# Patient Record
Sex: Female | Born: 1940 | Race: White | Hispanic: No | Marital: Married | Smoking: Never smoker
Health system: Southern US, Community
[De-identification: ages and names within clinical notes are randomized; demographics above are authoritative.]

## PROBLEM LIST (undated history)

## (undated) DIAGNOSIS — Z87718 Personal history of other specified (corrected) congenital malformations of genitourinary system: Secondary | ICD-10-CM

## (undated) DIAGNOSIS — N951 Menopausal and female climacteric states: Secondary | ICD-10-CM

## (undated) DIAGNOSIS — I639 Cerebral infarction, unspecified: Secondary | ICD-10-CM

## (undated) DIAGNOSIS — J45909 Unspecified asthma, uncomplicated: Secondary | ICD-10-CM

## (undated) DIAGNOSIS — N189 Chronic kidney disease, unspecified: Secondary | ICD-10-CM

## (undated) DIAGNOSIS — G459 Transient cerebral ischemic attack, unspecified: Secondary | ICD-10-CM

## (undated) DIAGNOSIS — I10 Essential (primary) hypertension: Secondary | ICD-10-CM

## (undated) DIAGNOSIS — L309 Dermatitis, unspecified: Secondary | ICD-10-CM

## (undated) DIAGNOSIS — M1990 Unspecified osteoarthritis, unspecified site: Secondary | ICD-10-CM

## (undated) DIAGNOSIS — D649 Anemia, unspecified: Secondary | ICD-10-CM

## (undated) DIAGNOSIS — K5792 Diverticulitis of intestine, part unspecified, without perforation or abscess without bleeding: Secondary | ICD-10-CM

## (undated) DIAGNOSIS — E785 Hyperlipidemia, unspecified: Secondary | ICD-10-CM

## (undated) HISTORY — DX: Transient cerebral ischemic attack, unspecified: G45.9

## (undated) HISTORY — DX: Unspecified osteoarthritis, unspecified site: M19.90

## (undated) HISTORY — DX: Cerebral infarction, unspecified: I63.9

## (undated) HISTORY — DX: Menopausal and female climacteric states: N95.1

## (undated) HISTORY — DX: Dermatitis, unspecified: L30.9

## (undated) HISTORY — DX: Personal history of other specified (corrected) congenital malformations of genitourinary system: Z87.718

## (undated) HISTORY — DX: Essential (primary) hypertension: I10

## (undated) HISTORY — PX: COLONOSCOPY: SHX174

## (undated) HISTORY — DX: Hyperlipidemia, unspecified: E78.5

## (undated) HISTORY — DX: Diverticulitis of intestine, part unspecified, without perforation or abscess without bleeding: K57.92

## (undated) HISTORY — DX: Anemia, unspecified: D64.9

---

## 1961-04-24 HISTORY — PX: NEPHRECTOMY: SHX65

## 2006-04-24 HISTORY — PX: EYE SURGERY: SHX253

## 2008-08-07 ENCOUNTER — Ambulatory Visit: Payer: Self-pay | Admitting: Hematology and Oncology

## 2012-12-24 ENCOUNTER — Other Ambulatory Visit: Payer: Self-pay | Admitting: *Deleted

## 2012-12-24 DIAGNOSIS — G459 Transient cerebral ischemic attack, unspecified: Secondary | ICD-10-CM

## 2012-12-25 ENCOUNTER — Encounter: Payer: Self-pay | Admitting: Vascular Surgery

## 2013-01-20 ENCOUNTER — Encounter: Payer: Self-pay | Admitting: Vascular Surgery

## 2013-01-21 ENCOUNTER — Ambulatory Visit (INDEPENDENT_AMBULATORY_CARE_PROVIDER_SITE_OTHER): Payer: Medicare Other | Admitting: Vascular Surgery

## 2013-01-21 ENCOUNTER — Encounter: Payer: Self-pay | Admitting: Vascular Surgery

## 2013-01-21 ENCOUNTER — Ambulatory Visit (HOSPITAL_COMMUNITY)
Admission: RE | Admit: 2013-01-21 | Discharge: 2013-01-21 | Disposition: A | Discharge status: Home / Self Care | Payer: Medicare Other | Source: Ambulatory Visit | Attending: Vascular Surgery | Admitting: Vascular Surgery

## 2013-01-21 DIAGNOSIS — G459 Transient cerebral ischemic attack, unspecified: Secondary | ICD-10-CM

## 2013-01-21 DIAGNOSIS — I6529 Occlusion and stenosis of unspecified carotid artery: Secondary | ICD-10-CM

## 2013-01-21 NOTE — Progress Notes (Signed)
[facility] and [facility]ist of [place]   Patient name: Marcia Summers MRN: [id number] DOB: [birth year] Sex: female   Referred by: Lorin Picket  Reason for referral:  Chief Complaint  Patient presents with  . Carotid    new pt, TIA (repeating outside exam) Dr. 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 She is a pleasant 72 year old female who recently has had several episodes of generalized weakness. She reports this initially feeling is that she is going to have a tension headache and then had generalized weakness extending throughout her body. She had a repeat episode several days in a row and on one instance reports that she had difficulty raising her feet. On discussing she cannot recall whether this was right or left but thinks it is both lower extremities. There is no other documentation no focal deficit to include the fascia. He was treated with aspirin Plavix and had resolution of this although she is having difficulty with bruising related to the antiplatelet treatment. She did undergo carotid duplex and 2-D echocardiogram at [facility] and I have these were reviewed. The carotid duplex does not show any evidence of significant stenosis with some mild plaque bilaterally. She is seen today for further discussion  Past Medical History  Diagnosis Date  . Hypertension   . Menopausal syndrome   . History of multicystic dysplastic kidney   . Diverticulitis   . Arthritis     GOUT  . Hyperlipidemia   . Eczema   . TIA (transient ischemic attack)   . Anemia   . Stroke     Past Surgical History  Procedure Laterality Date  . 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 Eye surgery Bilateral 2008    Cataract Surgery    History   Social History  . Marital Status: Single    Spouse Name: N/A    Number of Children: N/A  . Years of Education: N/A   Occupational History  . Not on  file.   Social History Main Topics  . Smoking status: Never Smoker   . Smokeless tobacco: Not on file  . Alcohol Use: No  . Drug Use: No  . Sexual Activity: Not on file   Other Topics Concern  . Not on file   Social History Narrative  . No narrative on file    Family History  Problem Relation Age of Onset  . Hypertension Other   . Stroke Other   . Heart disease Mother   . Hypertension Mother   . Heart attack Mother   . Heart disease Father     before age 48  . Heart attack Father     Allergies as of 01/21/2013 - Review Complete 01/21/2013  Allergen Reaction Noted  . Allegra-d [fexofenadine-pseudoephed er]  12/25/2012  . Amlodipine  01/21/2013  . Bisoprolol fumarate  01/21/2013  . Hctz [hydrochlorothiazide] Hives and Photosensitivity 12/25/2012  . Losartan potassium  01/21/2013  . Neosporin [neomycin-bacitracin zn-polymyx]  12/25/2012  . Penicillins  12/25/2012  . Sulfur  01/21/2013  . Torsemide  01/21/2013    Current Outpatient Prescriptions on File Prior to Visit  Medication Sig Dispense Refill  . carvedilol (COREG) 12.5 MG tablet Take 12.5 mg by mouth daily.      . clopidogrel (PLAVIX) 75 MG tablet Take 75 mg by mouth daily.      . cyanocobalamin (,VITAMIN B-12,) 1000 MCG/ML injection Inject 1,000 mcg into the  muscle every 30 (thirty) days.      . Febuxostat (ULORIC) 80 MG TABS Take 80 mg by mouth daily.      . rosuvastatin (CRESTOR) 10 MG tablet Take 10 mg by mouth daily.      . betamethasone dipropionate (DIPROLENE) 0.05 % cream Apply topically 2 (two) times daily.      . Doxycycline Hyclate 20 MG CAPS Take by mouth.      . estradiol (ESTRACE) 0.1 MG/GM vaginal cream Place 2 g vaginally daily.      Marland Kitchen triamcinolone (NASACORT) 55 MCG/ACT nasal inhaler Place 1 spray into the nose daily.       No current facility-administered medications on file prior to visit.     REVIEW OF SYSTEMS:  Positives indicated with an "X"  CARDIOVASCULAR:  [ ]  chest pain   [ ]   chest pressure   [ ]  palpitations   [ ]  orthopnea   [ ]  dyspnea on exertion   [ ]  claudication   [ ]  rest pain   [ ]  DVT   [ ]  phlebitis PULMONARY:   [ ]  productive cough   [ ]  asthma   [ ]  wheezing NEUROLOGIC:   [ ]  weakness  [ ]  paresthesias  [ ]  aphasia  [ ]  amaurosis  [ ]  dizziness HEMATOLOGIC:   [ ]  bleeding problems   [ ]  clotting disorders MUSCULOSKELETAL:  [ ]  joint pain   [ ]  joint swelling GASTROINTESTINAL: [ ]   blood in stool  [ ]   hematemesis GENITOURINARY:  [ ]   dysuria  [ ]   hematuria PSYCHIATRIC:  [ ]  history of major depression INTEGUMENTARY:  [ ]  rashes  [ ]  ulcers CONSTITUTIONAL:  [ ]  fever   [ ]  chills  PHYSICAL EXAMINATION:  General: The patient is a well-nourished female, in no acute distress. Vital signs are BP 178/67  Pulse 50  Ht 5' 6.5" (1.689 m)  Wt 184 lb 4.8 oz (83.598 kg)  BMI 29.3 kg/m2  SpO2 100% Pulmonary: There is a good air exchange bilaterally without wheezing or rales. Abdomen: Soft and non-tender with normal pitch bowel sounds. Musculoskeletal: There are no major deformities.  There is no significant extremity pain. Neurologic: No focal weakness or paresthesias are detected, Skin: There are no ulcer or rashes noted. Psychiatric: The patient has normal affect. Cardiovascular: There is a regular rate and rhythm without significant murmur appreciated. Carotid arteries without bruits bilaterally 2+ radial pulses bilaterally  [facility] Vascular Lab Studies:  Ordered and Independently Reviewed her carotid duplex today revealed widely patent carotid arteries bilaterally with minimal thickening in the arteries.  Impression and Plan:  Vague nonfocal neurologic events which appears their responded to antiplatelet treatment. The patient and her family can give no history of focal neurologic deficit. I reviewed her duplex with the patient. I feel that she has no evidence of any significant carotid disease. She was reassured this discussion. I would not repeat  study and that she has new focal neurologic events. She will see [place] again on an as-needed basis    Maymuna Detzel [facility] [place] Office: [phone]

## 2015-12-21 ENCOUNTER — Other Ambulatory Visit: Payer: Self-pay

## 2016-06-27 ENCOUNTER — Encounter: Payer: Self-pay | Admitting: Vascular Surgery

## 2016-06-28 ENCOUNTER — Other Ambulatory Visit: Payer: Self-pay

## 2016-06-28 DIAGNOSIS — N185 Chronic kidney disease, stage 5: Secondary | ICD-10-CM

## 2016-08-16 ENCOUNTER — Other Ambulatory Visit (HOSPITAL_COMMUNITY): Payer: Medicare Other

## 2016-08-16 ENCOUNTER — Encounter (HOSPITAL_COMMUNITY): Payer: Medicare Other

## 2016-08-16 ENCOUNTER — Encounter: Payer: Medicare Other | Admitting: Vascular Surgery

## 2016-08-17 ENCOUNTER — Encounter: Payer: Self-pay | Admitting: Vascular Surgery

## 2016-08-29 ENCOUNTER — Ambulatory Visit (INDEPENDENT_AMBULATORY_CARE_PROVIDER_SITE_OTHER): Payer: Medicare Other | Admitting: Vascular Surgery

## 2016-08-29 ENCOUNTER — Encounter: Payer: Self-pay | Admitting: Vascular Surgery

## 2016-08-29 ENCOUNTER — Other Ambulatory Visit: Payer: Self-pay

## 2016-08-29 ENCOUNTER — Ambulatory Visit (INDEPENDENT_AMBULATORY_CARE_PROVIDER_SITE_OTHER)
Admission: RE | Admit: 2016-08-29 | Discharge: 2016-08-29 | Disposition: A | Discharge status: Home / Self Care | Payer: Medicare Other | Source: Ambulatory Visit | Attending: Vascular Surgery | Admitting: Vascular Surgery

## 2016-08-29 ENCOUNTER — Ambulatory Visit (HOSPITAL_COMMUNITY)
Admission: RE | Admit: 2016-08-29 | Discharge: 2016-08-29 | Disposition: A | Discharge status: Home / Self Care | Payer: Medicare Other | Source: Ambulatory Visit | Attending: Vascular Surgery | Admitting: Vascular Surgery

## 2016-08-29 VITALS — BP 178/82 | HR 55 | Temp 97.1°F | Resp 18 | Ht 66.5 in | Wt 164.0 lb

## 2016-08-29 DIAGNOSIS — N185 Chronic kidney disease, stage 5: Secondary | ICD-10-CM

## 2016-08-29 DIAGNOSIS — N184 Chronic kidney disease, stage 4 (severe): Secondary | ICD-10-CM | POA: Diagnosis not present

## 2016-08-29 NOTE — Progress Notes (Signed)
[facility] and [facility] Specialist of [place]  Patient name: Marcia Summers [place] MRN: [id number] DOB: 07-27-1940 Sex: female  REASON FOR VISIT: Evaluation for AV access placement  HPI: Marcia Summers Orf is a 76 y.o. female seen today for discussion of AV access placement. I'd seen her several years ago which time she had a focal neurologic event with some unsteadiness. Duplex revealed no significant carotid disease and she was to be seen again on an as-needed basis. She is seen today for evaluation of hemodialysis access. 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 She reports that for the top proximally last 2 years she has progressive renal insufficiency and is now being discussed for renal access. She is also had discussion regarding possible renal transplant. She has not been on dialysis. She is right-handed  Past Medical History:  Diagnosis Date  . Anemia   . Arthritis    GOUT  . Diverticulitis   . Eczema   . History of multicystic dysplastic kidney   . Hyperlipidemia   . Hypertension   . Menopausal syndrome   . Stroke (HCC)   . TIA (transient ischemic attack)     Family History  Problem Relation Age of Onset  . Hypertension Other   . Stroke Other   . Heart disease Mother   . Hypertension Mother   . Heart attack Mother   . Heart disease Father     before age 77  . Heart attack Father     SOCIAL HISTORY: Social History  Substance Use Topics  . Smoking status: Never Smoker  . Smokeless tobacco: Never Used  . Alcohol use No    Allergies  Allergen Reactions  . Allegra-D [Fexofenadine-Pseudoephed Er]   . Amlodipine   . Bisoprolol Fumarate   . Felodipine Other (See Comments)    Pedal edema, serum sickness  . Hctz [Hydrochlorothiazide] Hives and Photosensitivity  . Losartan Potassium   . Neosporin [Neomycin-Bacitracin Zn-Polymyx]   . Penicillins   . Sulfur   . Torsemide   . Benzalkonium Chloride Rash    Current Outpatient  Prescriptions  Medication Sig Dispense Refill  . aspirin 81 MG tablet Take 81 mg by mouth daily.    . betamethasone dipropionate (DIPROLENE) 0.05 % cream Apply topically 2 (two) times daily.    . carvedilol (COREG) 12.5 MG tablet Take 12.5 mg by mouth daily.    . cloNIDine (CATAPRES) 0.3 MG tablet Take 0.3 mg by mouth. Take 1 tablet every day at lunch if BP is above 160 sys    . cyanocobalamin (,VITAMIN B-12,) 1000 MCG/ML injection Inject 1,000 mcg into the muscle every 30 (thirty) days.    . Doxycycline Hyclate 20 MG CAPS Take by mouth.    . estradiol (ESTRACE) 0.1 MG/GM vaginal cream Place 2 g vaginally daily.    . Febuxostat (ULORIC) 80 MG TABS Take 80 mg by mouth daily.    . rosuvastatin (CRESTOR) 10 MG tablet Take 10 mg by mouth daily.    . sodium bicarbonate 325 MG tablet Take 325 mg by mouth 4 (four) times daily.    Marland Kitchen. triamcinolone (NASACORT) 55 MCG/ACT nasal inhaler Place 1 spray into the nose daily.    . clopidogrel (PLAVIX) 75 MG tablet Take 75 mg by mouth daily.     No current facility-administered medications for this visit.     REVIEW OF SYSTEMS:  [X]  denotes positive finding, [ ]  denotes negative finding Cardiac  Comments:  Chest pain or chest pressure:  Shortness of breath upon exertion:    Short of breath when lying flat:    Irregular heart rhythm:        Vascular    Pain in calf, thigh, or hip brought on by ambulation:    Pain in feet at night that wakes you up from your sleep:     Blood clot in your veins:    Leg swelling:           PHYSICAL EXAM: Vitals:   08/29/16 1356 08/29/16 1357  BP: (!) 184/80 (!) 178/82  Pulse: (!) 55   Resp: 18   Temp: 97.1 F (36.2 C)   TempSrc: Oral   SpO2: 97%   Weight: 164 lb (74.4 kg)   Height: 5' 6.5" (1.689 m)     GENERAL: The patient is a well-nourished female, in no acute distress. The vital signs are documented above. CARDIOVASCULAR: 2+ radial pulses bilaterally PULMONARY: There is good air exchange    MUSCULOSKELETAL: There are no major deformities or cyanosis. NEUROLOGIC: No focal weakness or paresthesias are detected. SKIN: There are no ulcers or rashes noted. PSYCHIATRIC: The patient has a normal affect.  DATA:  Right and left upper arm vein mapping and arterial studies were reviewed. She does have moderate-sized cephalic vein throughout its course bilaterally. Normal triphasic radial and ulnar and brachial signals bilaterally  I did image her left arm cephalic vein with SonoSite ultrasound. She has easily palpable cephalic vein at the wrist and this does remain same caliber throughout the forearm to the antecubital space.  MEDICAL ISSUES: Had long discussion with patient regarding options for hemodialysis to include tunnel catheter, AV fistula and AV graft placement. I do feel she is a candidate for attempted left arm AV fistula. I did explain the potential for non-maturation of this and that she would have have ongoing consideration for access if this fails. She understands and wished to proceed as soon as possible.    Larina Earthly, MD FACS [facility] Tel [phone] Pager [phone]

## 2016-09-08 ENCOUNTER — Encounter (HOSPITAL_COMMUNITY): Payer: Self-pay | Admitting: *Deleted

## 2016-09-08 NOTE — Progress Notes (Signed)
Mrs Marcia Summers reports that Plavix has been on hold for a while,  "I only take it 3 times a week, I guess I don't need it , they say I did not have a stroke."  Mrs Marcia Summers denies chest pain or shortness of breath.  Plavix is ordered by PCP, Dr Lorin PicketScott.

## 2016-09-10 NOTE — Anesthesia Preprocedure Evaluation (Addendum)
Anesthesia Evaluation  Patient identified by MRN, date of birth, ID band Patient awake    Reviewed: Allergy & Precautions, NPO status , Patient's Chart, lab work & pertinent test results, reviewed documented beta blocker date and time   History of Anesthesia Complications Negative for: history of anesthetic complications  Airway Mallampati: II  TM Distance: >3 FB Neck ROM: Full    Dental  (+) Dental Advisory Given   Pulmonary neg pulmonary ROS,    breath sounds clear to auscultation       Cardiovascular hypertension, Pt. on home beta blockers and Pt. on medications (-) angina Rhythm:Regular Rate:Normal  '14 ECHO: EF >55%, valves OK   Neuro/Psych TIA   GI/Hepatic negative GI ROS, Neg liver ROS,   Endo/Other  negative endocrine ROS  Renal/GU CRF and Renal InsufficiencyRenal disease (K+ 4.1, no dialysis yet)     Musculoskeletal  (+) Arthritis ,   Abdominal   Peds  Hematology  (+) Blood dyscrasia (Hb 9.5), anemia ,   Anesthesia Other Findings   Reproductive/Obstetrics                            Anesthesia Physical Anesthesia Plan  ASA: III  Anesthesia Plan: MAC   Post-op Pain Management:    Induction: Intravenous  Airway Management Planned: Natural Airway and Nasal Cannula  Additional Equipment:   Intra-op Plan:   Post-operative Plan:   Informed Consent: I have reviewed the patients History and Physical, chart, labs and discussed the procedure including the risks, benefits and alternatives for the proposed anesthesia with the patient or authorized representative who has indicated his/her understanding and acceptance.   Dental advisory given  Plan Discussed with: CRNA and Surgeon  Anesthesia Plan Comments: (Plan routine monitors, MAC)        Anesthesia Quick Evaluation

## 2016-09-11 ENCOUNTER — Encounter (HOSPITAL_COMMUNITY): Payer: Self-pay | Admitting: Anesthesiology

## 2016-09-11 ENCOUNTER — Ambulatory Visit (HOSPITAL_COMMUNITY): Payer: Medicare Other | Admitting: Anesthesiology

## 2016-09-11 ENCOUNTER — Ambulatory Visit (HOSPITAL_COMMUNITY)
Admission: RE | Admit: 2016-09-11 | Discharge: 2016-09-11 | Disposition: A | Discharge status: Home / Self Care | Payer: Medicare Other | Source: Ambulatory Visit | Attending: Vascular Surgery | Admitting: Vascular Surgery

## 2016-09-11 ENCOUNTER — Encounter (HOSPITAL_COMMUNITY)
Admission: RE | Disposition: A | Discharge status: Home / Self Care | Payer: Self-pay | Source: Ambulatory Visit | Attending: Vascular Surgery

## 2016-09-11 DIAGNOSIS — N184 Chronic kidney disease, stage 4 (severe): Secondary | ICD-10-CM | POA: Diagnosis not present

## 2016-09-11 DIAGNOSIS — Z8673 Personal history of transient ischemic attack (TIA), and cerebral infarction without residual deficits: Secondary | ICD-10-CM | POA: Insufficient documentation

## 2016-09-11 DIAGNOSIS — E785 Hyperlipidemia, unspecified: Secondary | ICD-10-CM | POA: Insufficient documentation

## 2016-09-11 DIAGNOSIS — Z823 Family history of stroke: Secondary | ICD-10-CM | POA: Diagnosis not present

## 2016-09-11 DIAGNOSIS — Z7902 Long term (current) use of antithrombotics/antiplatelets: Secondary | ICD-10-CM | POA: Diagnosis not present

## 2016-09-11 DIAGNOSIS — Q614 Renal dysplasia: Secondary | ICD-10-CM | POA: Insufficient documentation

## 2016-09-11 DIAGNOSIS — I129 Hypertensive chronic kidney disease with stage 1 through stage 4 chronic kidney disease, or unspecified chronic kidney disease: Secondary | ICD-10-CM | POA: Diagnosis present

## 2016-09-11 DIAGNOSIS — M1990 Unspecified osteoarthritis, unspecified site: Secondary | ICD-10-CM | POA: Insufficient documentation

## 2016-09-11 DIAGNOSIS — D649 Anemia, unspecified: Secondary | ICD-10-CM | POA: Insufficient documentation

## 2016-09-11 DIAGNOSIS — Z79899 Other long term (current) drug therapy: Secondary | ICD-10-CM | POA: Insufficient documentation

## 2016-09-11 DIAGNOSIS — N189 Chronic kidney disease, unspecified: Secondary | ICD-10-CM | POA: Insufficient documentation

## 2016-09-11 DIAGNOSIS — Z888 Allergy status to other drugs, medicaments and biological substances status: Secondary | ICD-10-CM | POA: Insufficient documentation

## 2016-09-11 DIAGNOSIS — Z8249 Family history of ischemic heart disease and other diseases of the circulatory system: Secondary | ICD-10-CM | POA: Diagnosis not present

## 2016-09-11 DIAGNOSIS — Z882 Allergy status to sulfonamides status: Secondary | ICD-10-CM | POA: Insufficient documentation

## 2016-09-11 DIAGNOSIS — Z7982 Long term (current) use of aspirin: Secondary | ICD-10-CM | POA: Diagnosis not present

## 2016-09-11 DIAGNOSIS — Z880 Allergy status to penicillin: Secondary | ICD-10-CM | POA: Insufficient documentation

## 2016-09-11 DIAGNOSIS — M109 Gout, unspecified: Secondary | ICD-10-CM | POA: Insufficient documentation

## 2016-09-11 HISTORY — PX: AV FISTULA PLACEMENT: SHX1204

## 2016-09-11 HISTORY — DX: Chronic kidney disease, unspecified: N18.9

## 2016-09-11 HISTORY — DX: Unspecified asthma, uncomplicated: J45.909

## 2016-09-11 LAB — POCT I-STAT 4, (NA,K, GLUC, HGB,HCT)
GLUCOSE: 100 mg/dL — AB (ref 65–99)
HEMATOCRIT: 28 % — AB (ref 36.0–46.0)
Hemoglobin: 9.5 g/dL — ABNORMAL LOW (ref 12.0–15.0)
Potassium: 4.1 mmol/L (ref 3.5–5.1)
SODIUM: 144 mmol/L (ref 135–145)

## 2016-09-11 SURGERY — ARTERIOVENOUS (AV) FISTULA CREATION
Anesthesia: Monitor Anesthesia Care | Site: Arm Lower | Laterality: Left

## 2016-09-11 MED ORDER — SODIUM CHLORIDE 0.9 % IV SOLN
INTRAVENOUS | Status: DC
  Administered 2016-09-11 (×2): via INTRAVENOUS

## 2016-09-11 MED ORDER — FENTANYL CITRATE (PF) 250 MCG/5ML IJ SOLN
INTRAMUSCULAR | Status: AC
  Filled 2016-09-11: qty 5

## 2016-09-11 MED ORDER — MEPERIDINE HCL 25 MG/ML IJ SOLN: 6.2500 mg | INTRAMUSCULAR | Status: DC | PRN

## 2016-09-11 MED ORDER — ONDANSETRON HCL 4 MG/2ML IJ SOLN
INTRAMUSCULAR | Status: AC
  Filled 2016-09-11: qty 2

## 2016-09-11 MED ORDER — FENTANYL CITRATE (PF) 100 MCG/2ML IJ SOLN: 25.0000 ug | INTRAMUSCULAR | Status: DC | PRN

## 2016-09-11 MED ORDER — OXYCODONE-ACETAMINOPHEN 5-325 MG PO TABS: 1.0000 | ORAL_TABLET | Freq: Four times a day (QID) | ORAL | 0 refills | Status: AC | PRN

## 2016-09-11 MED ORDER — DEXAMETHASONE SODIUM PHOSPHATE 10 MG/ML IJ SOLN
INTRAMUSCULAR | Status: AC
  Filled 2016-09-11: qty 1

## 2016-09-11 MED ORDER — PROMETHAZINE HCL 25 MG/ML IJ SOLN: 6.2500 mg | INTRAMUSCULAR | Status: DC | PRN

## 2016-09-11 MED ORDER — VANCOMYCIN HCL IN DEXTROSE 1-5 GM/200ML-% IV SOLN
1000.0000 mg | INTRAVENOUS | Status: AC
  Administered 2016-09-11: 1000 mg via INTRAVENOUS
  Filled 2016-09-11: qty 200

## 2016-09-11 MED ORDER — 0.9 % SODIUM CHLORIDE (POUR BTL) OPTIME
TOPICAL | Status: DC | PRN
  Administered 2016-09-11: 1000 mL

## 2016-09-11 MED ORDER — MIDAZOLAM HCL 2 MG/2ML IJ SOLN: 0.5000 mg | Freq: Once | INTRAMUSCULAR | Status: DC | PRN

## 2016-09-11 MED ORDER — CHLORHEXIDINE GLUCONATE CLOTH 2 % EX PADS: 6.0000 | MEDICATED_PAD | Freq: Once | CUTANEOUS | Status: DC

## 2016-09-11 MED ORDER — MIDAZOLAM HCL 2 MG/2ML IJ SOLN
INTRAMUSCULAR | Status: AC
  Filled 2016-09-11: qty 2

## 2016-09-11 MED ORDER — FENTANYL CITRATE (PF) 100 MCG/2ML IJ SOLN
INTRAMUSCULAR | Status: DC | PRN
Start: 2016-09-11 — End: 2016-09-11
  Administered 2016-09-11: 50 ug via INTRAVENOUS

## 2016-09-11 MED ORDER — PROPOFOL 10 MG/ML IV BOLUS
INTRAVENOUS | Status: AC
  Filled 2016-09-11: qty 20

## 2016-09-11 MED ORDER — HEPARIN SODIUM (PORCINE) 1000 UNIT/ML IJ SOLN
INTRAMUSCULAR | Status: AC
  Filled 2016-09-11: qty 1

## 2016-09-11 MED ORDER — MIDAZOLAM HCL 5 MG/5ML IJ SOLN
INTRAMUSCULAR | Status: DC | PRN
  Administered 2016-09-11 (×2): 1 mg via INTRAVENOUS

## 2016-09-11 MED ORDER — LIDOCAINE HCL 1 % IJ SOLN
INTRAMUSCULAR | Status: DC | PRN
  Administered 2016-09-11: 20 mL

## 2016-09-11 MED ORDER — LIDOCAINE HCL 1 % IJ SOLN
INTRAMUSCULAR | Status: AC
  Filled 2016-09-11: qty 40

## 2016-09-11 MED ORDER — DEXAMETHASONE SODIUM PHOSPHATE 4 MG/ML IJ SOLN
INTRAMUSCULAR | Status: DC | PRN
  Administered 2016-09-11: 4 mg via INTRAVENOUS

## 2016-09-11 MED ORDER — PROTAMINE SULFATE 10 MG/ML IV SOLN
INTRAVENOUS | Status: AC
  Filled 2016-09-11: qty 5

## 2016-09-11 MED ORDER — LIDOCAINE 2% (20 MG/ML) 5 ML SYRINGE
INTRAMUSCULAR | Status: AC
  Filled 2016-09-11: qty 5

## 2016-09-11 MED ORDER — ONDANSETRON HCL 4 MG/2ML IJ SOLN
INTRAMUSCULAR | Status: DC | PRN
  Administered 2016-09-11: 4 mg via INTRAVENOUS

## 2016-09-11 MED ORDER — PROPOFOL 500 MG/50ML IV EMUL
INTRAVENOUS | Status: DC | PRN
  Administered 2016-09-11: 75 ug/kg/min via INTRAVENOUS

## 2016-09-11 MED ORDER — SODIUM CHLORIDE 0.9 % IV SOLN
INTRAVENOUS | Status: DC | PRN
  Administered 2016-09-11: 08:00:00

## 2016-09-11 SURGICAL SUPPLY — 35 items
ADH SKN CLS APL DERMABOND .7 (GAUZE/BANDAGES/DRESSINGS) ×1
ARMBAND PINK RESTRICT EXTREMIT (MISCELLANEOUS) ×6 IMPLANT
CANISTER SUCT 3000ML PPV (MISCELLANEOUS) ×3 IMPLANT
CANNULA VESSEL 3MM 2 BLNT TIP (CANNULA) ×3 IMPLANT
CLIP LIGATING EXTRA MED SLVR (CLIP) ×3 IMPLANT
CLIP LIGATING EXTRA SM BLUE (MISCELLANEOUS) ×3 IMPLANT
COVER PROBE W GEL 5X96 (DRAPES) ×3 IMPLANT
DECANTER SPIKE VIAL GLASS SM (MISCELLANEOUS) ×3 IMPLANT
DERMABOND ADVANCED (GAUZE/BANDAGES/DRESSINGS) ×2
DERMABOND ADVANCED .7 DNX12 (GAUZE/BANDAGES/DRESSINGS) ×1 IMPLANT
ELECT REM PT RETURN 9FT ADLT (ELECTROSURGICAL) ×3
ELECTRODE REM PT RTRN 9FT ADLT (ELECTROSURGICAL) ×1 IMPLANT
GAUZE SPONGE 4X4 12PLY STRL (GAUZE/BANDAGES/DRESSINGS) ×3 IMPLANT
GLOVE BIO SURGEON STRL SZ 6.5 (GLOVE) ×4 IMPLANT
GLOVE BIO SURGEONS STRL SZ 6.5 (GLOVE) ×4
GLOVE BIOGEL PI IND STRL 6.5 (GLOVE) IMPLANT
GLOVE BIOGEL PI IND STRL 7.0 (GLOVE) IMPLANT
GLOVE BIOGEL PI INDICATOR 6.5 (GLOVE) ×4
GLOVE BIOGEL PI INDICATOR 7.0 (GLOVE) ×2
GLOVE SS BIOGEL STRL SZ 7.5 (GLOVE) ×1 IMPLANT
GLOVE SUPERSENSE BIOGEL SZ 7.5 (GLOVE) ×2
GLOVE SURG SS PI 6.5 STRL IVOR (GLOVE) ×2 IMPLANT
GOWN STRL NON-REIN LRG LVL3 (GOWN DISPOSABLE) ×2 IMPLANT
GOWN STRL REUS W/ TWL LRG LVL3 (GOWN DISPOSABLE) ×3 IMPLANT
GOWN STRL REUS W/TWL LRG LVL3 (GOWN DISPOSABLE) ×12
KIT BASIN OR (CUSTOM PROCEDURE TRAY) ×3 IMPLANT
KIT ROOM TURNOVER OR (KITS) ×3 IMPLANT
NS IRRIG 1000ML POUR BTL (IV SOLUTION) ×3 IMPLANT
PACK CV ACCESS (CUSTOM PROCEDURE TRAY) ×3 IMPLANT
PAD ARMBOARD 7.5X6 YLW CONV (MISCELLANEOUS) ×6 IMPLANT
SUT PROLENE 6 0 CC (SUTURE) ×3 IMPLANT
SUT VIC AB 3-0 SH 27 (SUTURE) ×3
SUT VIC AB 3-0 SH 27X BRD (SUTURE) ×1 IMPLANT
UNDERPAD 30X30 (UNDERPADS AND DIAPERS) ×3 IMPLANT
WATER STERILE IRR 1000ML POUR (IV SOLUTION) ×3 IMPLANT

## 2016-09-11 NOTE — Transfer of Care (Signed)
Immediate Anesthesia Transfer of Care Note  Patient: Marcia Summers  Procedure(s) Performed: Procedure(s): RADIOCEPHALIC ARTERIOVENOUS (AV) FISTULA CREATION (Left)  Patient Location: PACU  Anesthesia Type:MAC  Level of Consciousness: awake, oriented and patient cooperative  Airway & Oxygen Therapy: Patient Spontanous Breathing and Patient connected to face mask oxygen  Post-op Assessment: Report given to RN and Post -op Vital signs reviewed and stable  Post vital signs: Reviewed  Last Vitals:  Vitals:   09/11/16 0610  BP: (!) (P) 254/77  Pulse: (!) (P) 55  Resp: (P) 18  Temp: (P) 36.4 C    Last Pain:  Vitals:   09/11/16 0610  TempSrc: (P) Oral         Complications: No apparent anesthesia complications

## 2016-09-11 NOTE — Interval H&P Note (Signed)
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 After consideration of risks, benefits and other options for treatment, the patient has consented to  Procedure(s): RADIOCEPHALIC ARTERIOVENOUS (AV) FISTULA CREATION (Left) as a surgical intervention .  The patient's history has been reviewed, patient examined, no change in status, stable for surgery.  I have reviewed the patient's chart and labs.  Questions were answered to the patient's satisfaction.     Gretta BeganEarly, Khylah Kendra

## 2016-09-11 NOTE — H&P (View-Only) (Signed)
[facility]ist of [place]  Patient name: Marcia KickJanice Summers MRN: [id number] DOB: 07-27-1940 Sex: female  REASON FOR VISIT: Evaluation for AV access placement  HPI: Marcia Summers is a 76 y.o. female seen today for discussion of AV access placement. I'd seen her several years ago which time she had a focal neurologic event with some unsteadiness. Duplex revealed no significant carotid disease and she was to be seen again on an as-needed basis. She is seen today for evaluation of hemodialysis access. 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 She reports that for the top proximally last 2 years she has progressive renal insufficiency and is now being discussed for renal access. She is also had discussion regarding possible renal transplant. She has not been on dialysis. She is right-handed  Past Medical History:  Diagnosis Date  . Anemia   . Arthritis    GOUT  . Diverticulitis   . Eczema   . History of multicystic dysplastic kidney   . Hyperlipidemia   . Hypertension   . Menopausal syndrome   . Stroke (HCC)   . TIA (transient ischemic attack)     Family History  Problem Relation Age of Onset  . Hypertension Other   . Stroke Other   . Heart disease Mother   . Hypertension Mother   . Heart attack Mother   . Heart disease Father     before age 77  . Heart attack Father     SOCIAL HISTORY: Social History  Substance Use Topics  . Smoking status: Never Smoker  . Smokeless tobacco: Never Used  . Alcohol use No    Allergies  Allergen Reactions  . Allegra-D [Fexofenadine-Pseudoephed Er]   . Amlodipine   . Bisoprolol Fumarate   . Felodipine Other (See Comments)    Pedal edema, serum sickness  . Hctz [Hydrochlorothiazide] Hives and Photosensitivity  . Losartan Potassium   . Neosporin [Neomycin-Bacitracin Zn-Polymyx]   . Penicillins   . Sulfur   . Torsemide   . Benzalkonium Chloride Rash    Current Outpatient  Prescriptions  Medication Sig Dispense Refill  . aspirin 81 MG tablet Take 81 mg by mouth daily.    . betamethasone dipropionate (DIPROLENE) 0.05 % cream Apply topically 2 (two) times daily.    . carvedilol (COREG) 12.5 MG tablet Take 12.5 mg by mouth daily.    . cloNIDine (CATAPRES) 0.3 MG tablet Take 0.3 mg by mouth. Take 1 tablet every day at lunch if BP is above 160 sys    . cyanocobalamin (,VITAMIN B-12,) 1000 MCG/ML injection Inject 1,000 mcg into the muscle every 30 (thirty) days.    . Doxycycline Hyclate 20 MG CAPS Take by mouth.    . estradiol (ESTRACE) 0.1 MG/GM vaginal cream Place 2 g vaginally daily.    . Febuxostat (ULORIC) 80 MG TABS Take 80 mg by mouth daily.    . rosuvastatin (CRESTOR) 10 MG tablet Take 10 mg by mouth daily.    . sodium bicarbonate 325 MG tablet Take 325 mg by mouth 4 (four) times daily.    Marland Kitchen. triamcinolone (NASACORT) 55 MCG/ACT nasal inhaler Place 1 spray into the nose daily.    . clopidogrel (PLAVIX) 75 MG tablet Take 75 mg by mouth daily.     No current facility-administered medications for this visit.     REVIEW OF SYSTEMS:  [X]  denotes positive finding, [ ]  denotes negative finding Cardiac  Comments:  Chest pain or chest pressure:  Shortness of breath upon exertion:    Short of breath when lying flat:    Irregular heart rhythm:        Vascular    Pain in calf, thigh, or hip brought on by ambulation:    Pain in feet at night that wakes you up from your sleep:     Blood clot in your veins:    Leg swelling:           PHYSICAL EXAM: Vitals:   08/29/16 1356 08/29/16 1357  BP: (!) 184/80 (!) 178/82  Pulse: (!) 55   Resp: 18   Temp: 97.1 F (36.2 C)   TempSrc: Oral   SpO2: 97%   Weight: 164 lb (74.4 kg)   Height: 5' 6.5" (1.689 m)     GENERAL: The patient is a well-nourished female, in no acute distress. The vital signs are documented above. CARDIOVASCULAR: 2+ radial pulses bilaterally PULMONARY: There is good air exchange    MUSCULOSKELETAL: There are no major deformities or cyanosis. NEUROLOGIC: No focal weakness or paresthesias are detected. SKIN: There are no ulcers or rashes noted. PSYCHIATRIC: The patient has a normal affect.  DATA:  Right and left upper arm vein mapping and arterial studies were reviewed. She does have moderate-sized cephalic vein throughout its course bilaterally. Normal triphasic radial and ulnar and brachial signals bilaterally  I did image her left arm cephalic vein with SonoSite ultrasound. She has easily palpable cephalic vein at the wrist and this does remain same caliber throughout the forearm to the antecubital space.  MEDICAL ISSUES: Had long discussion with patient regarding options for hemodialysis to include tunnel catheter, AV fistula and AV graft placement. I do feel she is a candidate for attempted left arm AV fistula. I did explain the potential for non-maturation of this and that she would have have ongoing consideration for access if this fails. She understands and wished to proceed as soon as possible.    Larina Earthly, MD FACS [facility] Tel [phone] Pager [phone]

## 2016-09-11 NOTE — Anesthesia Postprocedure Evaluation (Signed)
Anesthesia Post Note  Patient: BURNELL MATLIN  Procedure(s) Performed: Procedure(s) (LRB): RADIOCEPHALIC ARTERIOVENOUS (AV) FISTULA CREATION (Left)  Patient location during evaluation: PACU Anesthesia Type: MAC Level of consciousness: awake and alert, oriented and patient cooperative Pain management: pain level controlled Vital Signs Assessment: post-procedure vital signs reviewed and stable Respiratory status: spontaneous breathing, nonlabored ventilation and respiratory function stable Cardiovascular status: blood pressure returned to baseline and stable Postop Assessment: no signs of nausea or vomiting Anesthetic complications: no       Last Vitals:  Vitals:   09/11/16 0900 09/11/16 0924  BP: (!) 162/63 (!) 175/59  Pulse:  (!) 56  Resp:  16  Temp: 36.3 C     Last Pain:  Vitals:   09/11/16 0924  TempSrc:   PainSc: 0-No pain                 Jadynn Epping,E. Nuh Lipton

## 2016-09-11 NOTE — Discharge Instructions (Signed)
° ° °  09/11/2016 Marcia SicksJanice M Summers [id number] Jan 18, 1941  Surgeon(s): Early, Kristen Loaderodd F, MD  Procedure(s): RADIOCEPHALIC ARTERIOVENOUS (AV) FISTULA CREATION  x Do not stick fistula for 12 weeks

## 2016-09-11 NOTE — Op Note (Signed)
    OPERATIVE REPORT  DATE OF SURGERY: 09/11/2016  PATIENT: Marcia Summers, 76 y.o. female MRN: [id number]  DOB: Feb 14, 1941  PRE-OPERATIVE DIAGNOSIS: Chronic renal insufficiency  POST-OPERATIVE DIAGNOSIS:  Same  PROCEDURE: Left radiocephalic AV fistula creation  SURGEON:  Gretta Beganodd Darryl Willner, M.D.  PHYSICIAN ASSISTANT: Samantha Rhyne PA-C  ANESTHESIA:  Local with sedation  EBL: Minimal ml  No intake/output data recorded.  BLOOD ADMINISTERED: None  DRAINS: None  SPECIMEN: None  COUNTS CORRECT:  YES  PLAN OF CARE: PACU   PATIENT DISPOSITION:  PACU - hemodynamically stable  PROCEDURE DETAILS: The patient was taken to the operating room placed supine position where the area of the left arm was prepped and draped in usual sterile fashion. SonoSite ultrasound was used to visualize the cephalic vein which is been seen on preoperative mapping as well. This did show adequate size for fistula attempt. Using local anesthesia incision was made between the level of the cephalic vein and the radial artery at the wrist. Branches of the cephalic vein were ligated with 3-0 silk ties and divided. The vein was ligated distally and was divided and mobilized the level of the radial artery. The radial artery had minimal atherosclerotic change. The artery was exposed occluded proximally and distally and was opened with an 11 blade and extended longitudinally with Potts scissors. The artery was gently dilated. The vein was cut to the appropriate length and was spatulated and sewn end-to-side to the artery with a running 6-0 Prolene suture. A 1-1/2 dilator passed easily through the anastomosis prior to closure. The anastomosis was completed and clamps removed from the artery with good flow noted in the vein. The vein was visualized under the tunnel up above the incision and there was no kinking. The wound was irrigated with saline and was closed with 3-0 Vicryl in the subcutaneous and subcuticular tissue. Sterile  dressing was applied the patient was transferred to the recovery room in stable condition   [name], M.D., [facility] CenterFACS 09/11/2016 8:35 AM

## 2016-09-11 NOTE — Anesthesia Procedure Notes (Signed)
Procedure Name: MAC Date/Time: 09/11/2016 7:35 AM Performed by: Jenne Campus Pre-anesthesia Checklist: Patient identified, Emergency Drugs available, Suction available, Patient being monitored and Timeout performed Oxygen Delivery Method: Simple face mask

## 2016-09-11 NOTE — Progress Notes (Signed)
Husband notified in waiting room -per volunteer, he is waiting to talk to Dr Karlene LinemanEarley prior to coming back to PACU for discharge.

## 2016-09-12 ENCOUNTER — Encounter (HOSPITAL_COMMUNITY): Payer: Self-pay | Admitting: Vascular Surgery

## 2016-09-12 ENCOUNTER — Telehealth: Payer: Self-pay | Admitting: Vascular Surgery

## 2016-09-12 NOTE — Telephone Encounter (Signed)
Sched lab 10/17/16 at 4:00 and MD 10/24/16 at 1:30. No vm, mailed appt letters.

## 2016-09-12 NOTE — Telephone Encounter (Signed)
-----   Message from Sharee PimpleMarilyn K McChesney, RN sent at 09/11/2016  9:29 AM EDT ----- Regarding: 4-6 weeks   ----- Message ----- From: Dara Lordshyne, Samantha J, PA-C Sent: 09/11/2016   8:29 AM To: Vvs Charge Pool  S/p left RC AVF 09/11/16.  F/u with Dr. Arbie CookeyEarly in 4-6 weeks with duplex.  Thanks, Lelon MastSamantha

## 2016-09-26 NOTE — Addendum Note (Signed)
Addended by: Burton ApleyPETTY, Ayiden Milliman A on: 09/26/2016 09:38 AM   Modules accepted: Orders

## 2016-10-04 ENCOUNTER — Encounter: Payer: Self-pay | Admitting: Vascular Surgery

## 2016-10-17 ENCOUNTER — Ambulatory Visit (HOSPITAL_COMMUNITY): Admission: RE | Admit: 2016-10-17 | Payer: Medicare Other | Source: Ambulatory Visit

## 2016-10-17 ENCOUNTER — Encounter: Payer: Self-pay | Admitting: Vascular Surgery

## 2016-10-17 ENCOUNTER — Encounter (HOSPITAL_COMMUNITY): Payer: Medicare Other

## 2016-10-17 ENCOUNTER — Ambulatory Visit (INDEPENDENT_AMBULATORY_CARE_PROVIDER_SITE_OTHER): Payer: Medicare Other | Admitting: Vascular Surgery

## 2016-10-17 VITALS — BP 186/81 | HR 60 | Temp 97.0°F | Resp 16 | Ht 66.5 in | Wt 151.0 lb

## 2016-10-17 DIAGNOSIS — N184 Chronic kidney disease, stage 4 (severe): Secondary | ICD-10-CM

## 2016-10-17 NOTE — Progress Notes (Signed)
Vitals:   10/17/16 1625  BP: (!) 195/82  Pulse: 60  Resp: 16  Temp: 97 F (36.1 C)  SpO2: 99%  Weight: 151 lb (68.5 kg)  Height: 5' 6.5" (1.689 m)

## 2016-10-17 NOTE — Progress Notes (Signed)
Patient name: Marcia Summers MRN: [id number] DOB: [birth year] Sex: female  REASON FOR VISIT: Follow-up radiocephalic AV fistula creation by myself on 09/11/2016  HPI: Marcia Summers is a 76 y.o. female here today for follow-up. She is not on hemodialysis currently.  Current Outpatient Prescriptions  Medication Sig Dispense Refill  . acetaminophen (TYLENOL) 500 MG tablet Take 500 mg by mouth daily as needed for mild pain.    Marland Kitchen. aspirin 81 MG tablet Take 81 mg by mouth at bedtime.     . carvedilol (COREG) 12.5 MG tablet Take 12.5 mg by mouth 2 (two) times daily.     . cloNIDine (CATAPRES) 0.3 MG tablet Take 0.3 mg by mouth See admin instructions. Take 1 tablet every day at lunch if BP is above 160 sys     . cyanocobalamin (,VITAMIN B-12,) 1000 MCG/ML injection Inject 1,000 mcg into the muscle every 30 (thirty) days. 2 week of month    . Darbepoetin Alfa (ARANESP) 25 MCG/0.42ML SOSY injection Inject 25 mcg into the skin every 30 (thirty) days. 2nd week of the month    . Darbepoetin Alfa (ARANESP, ALBUMIN FREE,) 100 MCG/0.5ML SOSY injection Inject 100 mcg into the skin every 30 (thirty) days. 2 week of the month    . Febuxostat (ULORIC) 80 MG TABS Take 80 mg by mouth daily.    . hydrALAZINE (APRESOLINE) 25 MG tablet Take 25 mg by mouth 2 (two) times daily.    . isosorbide mononitrate (IMDUR) 30 MG 24 hr tablet Take 30 mg by mouth daily.    . rosuvastatin (CRESTOR) 10 MG tablet Take 10 mg by mouth every Monday, Wednesday, and Friday.     . sodium bicarbonate 325 MG tablet Take 325 mg by mouth 2 (two) times daily.     Marland Kitchen. triamcinolone cream (KENALOG) 0.1 % Apply 1 application topically daily. Apply to forehead    . clopidogrel (PLAVIX) 75 MG tablet Take 75 mg by mouth 3 (three) times a week. Tuesday, Thursday and Saturday    . oxyCODONE-acetaminophen (ROXICET) 5-325 MG tablet Take 1 tablet by mouth every 6 (six) hours as needed for severe pain. (Patient not taking:  Reported on 10/17/2016) 8 tablet 0   No current facility-administered medications for this visit.      PHYSICAL EXAM: Vitals:   10/17/16 1625 10/17/16 1627  BP: (!) 195/82 (!) 186/81  Pulse: 60 60  Resp: 16   Temp: 97 F (36.1 C)   SpO2: 99%   Weight: 151 lb (68.5 kg)   Height: 5' 6.5" (1.689 m)     GENERAL: The patient is a well-nourished female, in no acute distress. The vital signs are documented above. Physical exam reveals well-healed radiocephalic incision. I do not feel a thrill or hear a bruit. I imaged this with SonoSite ultrasound she has occluded the cephalic vein fistula at her radial artery.  I imaged both basilic veins in the upper arm and also her cephalic vein in the right arm. All of these are small.  MEDICAL ISSUES: Marcia Summers failure of attempted left radiocephalic fistula. I discussed this at length with the patient. I would not recommend basilic vein or right arm cephalic fistula. Her vein was certainly marginal and his failed. This was her best vein option. I would recommend prosthetic Gore-Tex graft AV graft when indicated. She will continue her follow-up with Dr. Eliott Summers and she will contact us when it is appropriate to place a prosthetic AV Gore-Tex graft in Mrs Marcia Summers  Marcia Posner, MD FACS [facility] Tel [phone] Pager [phone]

## 2016-10-24 ENCOUNTER — Encounter: Payer: Medicare Other | Admitting: Vascular Surgery

## 2016-11-07 ENCOUNTER — Encounter: Payer: Self-pay | Admitting: Family

## 2016-11-08 ENCOUNTER — Ambulatory Visit (INDEPENDENT_AMBULATORY_CARE_PROVIDER_SITE_OTHER): Payer: Self-pay | Admitting: Family

## 2016-11-08 ENCOUNTER — Encounter: Payer: Self-pay | Admitting: Family

## 2016-11-08 VITALS — BP 143/77 | HR 60 | Temp 97.8°F | Resp 16 | Ht 66.5 in | Wt 161.4 lb

## 2016-11-08 DIAGNOSIS — I770 Arteriovenous fistula, acquired: Secondary | ICD-10-CM

## 2016-11-08 DIAGNOSIS — T814XXA Infection following a procedure, initial encounter: Secondary | ICD-10-CM

## 2016-11-08 DIAGNOSIS — N184 Chronic kidney disease, stage 4 (severe): Secondary | ICD-10-CM

## 2016-11-08 DIAGNOSIS — IMO0001 Reserved for inherently not codable concepts without codable children: Secondary | ICD-10-CM

## 2016-11-08 NOTE — Progress Notes (Signed)
Postoperative Access Visit   History of Present Illness  Marcia Summers is a 76 y.o. year old female who is s/p radiocephalic AV fistula creation on 09/11/2016 by Dr. Arbie Cookey. She is not on hemodialysis yet.   Dr. Arbie Cookey last evaluated pt on 10-17-16. At that time Dr. Arbie Cookey did not feel a thrill or hear a bruit. he imaged this with SonoSite ultrasound, she has occluded the cephalic vein fistula at her radial artery. Dr. Arbie Cookey imaged both basilic veins in the upper arm and also her cephalic vein in the right arm. All of these are small. Early failure of attempted left radiocephalic fistula. IAt that visit Dr. Arbie Cookey discussed this at length with the patient; he would not recommend basilic vein or right arm cephalic fistula. Her vein was certainly marginal and has failed. This was her best vein option. Dr. Arbie Cookey would recommend prosthetic Gore-Tex graft AV graft when indicated. She will continue her follow-up with Dr. Eliott Nine and she will contact us when it is appropriate to place a prosthetic AV Gore-Tex graft in Marcia Summers.  Pt returns today with c/o concerns about redness at the proximal aspect of her well healed incision. This was more red yesterday than now, and pt states she saw the end of a white string.  Pt states her kidney function is such that she does not need dialysis any time soon.  She denies any steal symptoms in her left hand, denies fever or chills.    For VQI Use Only  PRE-ADM LIVING: Home  AMB STATUS: Ambulatory   Past Medical History:  Diagnosis Date  . Anemia   . Arthritis    GOUT  . Asthma    as a child  . Chronic kidney disease   . Diverticulitis    "no just a small pocket"  . Eczema   . History of multicystic dysplastic kidney   . Hyperlipidemia   . Hypertension   . Menopausal syndrome   . Stroke [facility])    "Dr Early says no"  . TIA (transient ischemic attack)     Past Surgical History:  Procedure Laterality Date  . AV FISTULA PLACEMENT Left  09/11/2016   Procedure: RADIOCEPHALIC ARTERIOVENOUS (AV) FISTULA CREATION;  Surgeon: Larina Earthly, MD;  Location: [facility] OR;  Service: Vascular;  Laterality: Left;  . COLONOSCOPY    . EYE SURGERY Bilateral 2008   Cataract Surgery  . 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 Marital status: Married    Spouse name: N/A  . Number of children: N/A  . Years of education: N/A   Occupational History  . Not on file.   Social History Main Topics  . Smoking status: Never Smoker  . Smokeless tobacco: Never Used  . Alcohol use No  . Drug use: No  . Sexual activity: Not on file   Other Topics Concern  . Not on file   Social History Narrative  . No narrative on file    Allergies  Allergen Reactions  . Penicillins Swelling and Rash    Has patient had a PCN reaction causing immediate rash, facial/tongue/throat swelling, SOB or lightheadedness with hypotension: Yes Has patient had a PCN reaction causing severe rash involving mucus membranes or skin necrosis: unknown Has patient had a PCN reaction that required hospitalization: No Has patient had a PCN reaction occurring within the last 10 years: Yes If all of the above answers are "  NO", then may proceed with Cephalosporin use.  . Allegra-D [Fexofenadine-Pseudoephed Er] Other (See Comments)    Due to high blood pressure   . Amlodipine Other (See Comments)    Severe Edema   . Bisoprolol Fumarate Other (See Comments)    unknown  . Felodipine Other (See Comments)    Pedal edema, serum sickness  . Hctz [Hydrochlorothiazide] Hives and Photosensitivity    Dermatitis   . Losartan Potassium Other (See Comments)    Unknown   . Neosporin [Neomycin-Bacitracin Zn-Polymyx] Other (See Comments)    Doesn't work on pt's wounds   . Sulfur Other (See Comments)    Unknown   . Torsemide Other (See Comments)    Unknown   . Benzalkonium Chloride Rash    Current Outpatient Prescriptions on File  Prior to Visit  Medication Sig Dispense Refill  . acetaminophen (TYLENOL) 500 MG tablet Take 500 mg by mouth daily as needed for mild pain.    Marland Kitchen aspirin 81 MG tablet Take 81 mg by mouth at bedtime.     . carvedilol (COREG) 12.5 MG tablet Take 12.5 mg by mouth 2 (two) times daily.     . cloNIDine (CATAPRES) 0.3 MG tablet Take 0.3 mg by mouth See admin instructions. Take 1 tablet every day at lunch if BP is above 160 sys     . clopidogrel (PLAVIX) 75 MG tablet Take 75 mg by mouth 3 (three) times a week. Tuesday, Thursday and Saturday    . cyanocobalamin (,VITAMIN B-12,) 1000 MCG/ML injection Inject 1,000 mcg into the muscle every 30 (thirty) days. 2 week of month    . Darbepoetin Alfa (ARANESP) 25 MCG/0.42ML SOSY injection Inject 25 mcg into the skin every 30 (thirty) days. 2nd week of the month    . Darbepoetin Alfa (ARANESP, ALBUMIN FREE,) 100 MCG/0.5ML SOSY injection Inject 100 mcg into the skin every 30 (thirty) days. 2 week of the month    . Febuxostat (ULORIC) 80 MG TABS Take 80 mg by mouth daily.    . hydrALAZINE (APRESOLINE) 25 MG tablet Take 25 mg by mouth 2 (two) times daily.    . isosorbide mononitrate (IMDUR) 30 MG 24 hr tablet Take 30 mg by mouth daily.    Marland Kitchen oxyCODONE-acetaminophen (ROXICET) 5-325 MG tablet Take 1 tablet by mouth every 6 (six) hours as needed for severe pain. 8 tablet 0  . rosuvastatin (CRESTOR) 10 MG tablet Take 10 mg by mouth every Monday, Wednesday, and Friday.     . sodium bicarbonate 325 MG tablet Take 325 mg by mouth 2 (two) times daily.     Marland Kitchen triamcinolone cream (KENALOG) 0.1 % Apply 1 application topically daily. Apply to forehead     No current facility-administered medications on file prior to visit.      Physical Examination Vitals:   11/08/16 1457  BP: (!) 143/77  Pulse: 60  Resp: 16  Temp: 97.8 F (36.6 C)  TempSrc: Oral  SpO2: 97%  Weight: 161 lb 6.4 oz (73.2 kg)  Height: 5' 6.5" (1.689 m)   Body mass index is 25.66 kg/m.   Small  healing scab over what was probably a small suture abscess that opened and drained. Minimal erythema surrounding the small scab at the proximal end of her well healed incision at the left forearm.  Hand grip is 5/5, sensation in digits is intact, no palpable thrill, bruit can no be auscultated.    Medical Decision Making  Marcia Summers is a 76 y.o. year  old female who presents s/p radiocephalic AV fistula creation on 09/11/2016. She is not in need of hemodialysis yet.   Pt had early failure of attempted left radiocephalic fistula. At her 10-17-16 visit Dr. Arbie CookeyEarly discussed this at length with the patient; he would not recommend basilic vein or right arm cephalic fistula. Her vein was certainly marginal and has failed. This was her best vein option. Dr. Arbie CookeyEarly would recommend prosthetic Gore-Tex graft AV graft when indicated. She will continue her follow-up with Dr. Eliott Nineunham and she will contact us when it is appropriate to place a prosthetic AV Gore-Tex graft in Marcia Summers.   Small healing suture abscess, cleansed with wound cleanser, I do not see the tip of the suture. I advised pt to notify us if this looks worse or she has any concerns re this healing suture abscess.   Follow up as needed.   Thank you for allowing us to participate in this patient's care.  Kendan Cornforth, Carma LairSUZANNE L, RN, MSN, FNP-C [facility] [place] Office: [phone]  11/08/2016, 3:19 PM  Clinic MD: Early on call

## 2016-11-10 ENCOUNTER — Ambulatory Visit: Payer: Medicare Other | Admitting: Family

## 2017-08-09 ENCOUNTER — Other Ambulatory Visit: Payer: Self-pay

## 2017-08-09 ENCOUNTER — Emergency Department (HOSPITAL_COMMUNITY)
Admission: EM | Admit: 2017-08-09 | Discharge: 2017-08-10 | Disposition: A | Discharge status: Home / Self Care | Payer: Medicare Other | Attending: Emergency Medicine | Admitting: Emergency Medicine

## 2017-08-09 DIAGNOSIS — Z940 Kidney transplant status: Secondary | ICD-10-CM | POA: Diagnosis not present

## 2017-08-09 DIAGNOSIS — I120 Hypertensive chronic kidney disease with stage 5 chronic kidney disease or end stage renal disease: Secondary | ICD-10-CM | POA: Insufficient documentation

## 2017-08-09 DIAGNOSIS — Z7982 Long term (current) use of aspirin: Secondary | ICD-10-CM | POA: Diagnosis not present

## 2017-08-09 DIAGNOSIS — J45909 Unspecified asthma, uncomplicated: Secondary | ICD-10-CM | POA: Diagnosis not present

## 2017-08-09 DIAGNOSIS — Z79899 Other long term (current) drug therapy: Secondary | ICD-10-CM | POA: Insufficient documentation

## 2017-08-09 DIAGNOSIS — N186 End stage renal disease: Secondary | ICD-10-CM | POA: Diagnosis not present

## 2017-08-09 DIAGNOSIS — E860 Dehydration: Secondary | ICD-10-CM | POA: Diagnosis not present

## 2017-08-09 DIAGNOSIS — Z7902 Long term (current) use of antithrombotics/antiplatelets: Secondary | ICD-10-CM | POA: Diagnosis not present

## 2017-08-09 DIAGNOSIS — R531 Weakness: Secondary | ICD-10-CM | POA: Diagnosis present

## 2017-08-09 MED ORDER — SODIUM CHLORIDE 0.9 % IV BOLUS
500.0000 mL | Freq: Once | INTRAVENOUS | Status: AC
  Administered 2017-08-10: 500 mL via INTRAVENOUS

## 2017-08-09 NOTE — ED Provider Notes (Addendum)
[facility] EMERGENCY DEPARTMENT Provider Note   CSN: [id number] Arrival date & time: 08/09/17  2325     History   Chief Complaint No chief complaint on file.   HPI Marcia Summers is a 77 y.o. female.  Patient is a 77 year old female with past medical history of renal transplant, hypertension, asthma, and prior TIA.  She is brought by EMS for evaluation of weakness and urinary incontinence.  The patient denies to me that she is experiencing any discomfort or any other symptoms with the exception of decreased appetite.  She has not taken her antirejection medication in several days because she states that she has had no appetite and is supposed to take this medication with food.  According to the family, she has become incontinent of urine which is not normal for her.  Per EMS, she felt warm and was thought to have a fever when vitals were taken by paramedics.  She is currently afebrile and denies prior fevers at home.  The history is provided by the patient.    Past Medical History:  Diagnosis Date  . Anemia   . Arthritis    GOUT  . Asthma    as a child  . Chronic kidney disease   . Diverticulitis    "no just a small pocket"  . Eczema   . History of multicystic dysplastic kidney   . Hyperlipidemia   . Hypertension   . Menopausal syndrome   . Stroke [facility])    "Dr Early says no"  . TIA (transient ischemic attack)     Patient Active Problem List   Diagnosis Date Noted  . 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 AV FISTULA PLACEMENT Left 09/11/2016   Procedure: RADIOCEPHALIC ARTERIOVENOUS (AV) FISTULA CREATION;  Surgeon: Larina Earthly, MD;  Location: [facility] OR;  Service: Vascular;  Laterality: Left;  . COLONOSCOPY    . EYE SURGERY Bilateral 2008   Cataract Surgery  . 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 Authorizing Provider  acetaminophen (TYLENOL) 500 MG tablet Take 500 mg by mouth daily as needed for mild pain.    [provider]  aspirin 81 MG tablet Take 81 mg by mouth at bedtime.     [provider]  carvedilol (COREG) 12.5 MG tablet Take 12.5 mg by mouth 2 (two) times daily.     [provider]  cloNIDine (CATAPRES) 0.3 MG tablet Take 0.3 mg by mouth See admin instructions. Take 1 tablet every day at lunch if BP is above 160 sys     [provider]  clopidogrel (PLAVIX) 75 MG tablet Take 75 mg by mouth 3 (three) times a week. Tuesday, Thursday and Saturday    [provider]  cyanocobalamin (,VITAMIN B-12,) 1000 MCG/ML injection Inject 1,000 mcg into the muscle every 30 (thirty) days. 2 week of month    [provider]  Darbepoetin Alfa (ARANESP) 25 MCG/0.42ML SOSY injection Inject 25 mcg into the skin every 30 (thirty) days. 2nd week of the month    [provider]  Darbepoetin Alfa (ARANESP, ALBUMIN FREE,) 100 MCG/0.5ML SOSY injection Inject 100 mcg into the skin every 30 (thirty) days. 2 week of  the month    [provider]  Febuxostat (ULORIC) 80 MG TABS Take 80 mg by mouth daily.    [provider]  hydrALAZINE (APRESOLINE) 25 MG tablet Take 25 mg by mouth 2 (two) times daily. 06/02/16   [provider]  isosorbide mononitrate (IMDUR) 30 MG 24 hr tablet Take 30 mg by mouth daily. 06/23/16   [provider]  oxyCODONE-acetaminophen (ROXICET) 5-325 MG tablet Take 1 tablet by mouth every 6 (six) hours as needed for severe pain. 09/11/16   Dara Lordshyne, Samantha J, PA-C  rosuvastatin (CRESTOR) 10 MG tablet Take 10 mg by mouth every Monday, Wednesday, and Friday.     [provider]  sodium bicarbonate 325 MG tablet Take 325 mg by mouth 2 (two) times daily.     [provider]   triamcinolone cream (KENALOG) 0.1 % Apply 1 application topically daily. Apply to forehead    [provider]    Family History Family History  Problem Relation Age of Onset  . Hypertension Other   . Stroke Other   . Heart disease Mother   . Hypertension Mother   . Heart attack Mother   . Heart disease Father        before age 78  . Heart attack Father     Social History Social History   Tobacco Use  . Smoking status: Never Smoker  . Smokeless tobacco: Never Used  Substance Use Topics  . Alcohol use: No  . Drug use: No     Allergies   Penicillins; Allegra-d [fexofenadine-pseudoephed er]; Amlodipine; Bisoprolol fumarate; Felodipine; Hctz [hydrochlorothiazide]; Losartan potassium; Neosporin [neomycin-bacitracin zn-polymyx]; Sulfur; Torsemide; and Benzalkonium chloride   Review of Systems Review of Systems  All other systems reviewed and are negative.    Physical Exam Updated Vital Signs BP (!) 142/76 (BP Location: Right Arm)   Pulse 86   Temp 98.9 F (37.2 C) (Oral)   Resp (!) 23   Ht 5' 6.5" (1.689 m)   Wt 73.5 kg (162 lb)   SpO2 94%   BMI 25.76 kg/m   Physical Exam  Constitutional: She is oriented to person, place, and time. No distress.  Patient is an elderly female in no acute distress.  She does appear chronically ill and somewhat debilitated.  HENT:  Head: Normocephalic and atraumatic.  Mucous membranes appear somewhat dry.  Eyes: Pupils are equal, round, and reactive to light. EOM are normal.  Neck: Normal range of motion. Neck supple.  Cardiovascular: Normal rate and regular rhythm. Exam reveals no gallop and no friction rub.  No murmur heard. Pulmonary/Chest: Effort normal and breath sounds normal. No respiratory distress. She has no wheezes.  Abdominal: Soft. Bowel sounds are normal. She exhibits no distension. There is no tenderness.  Musculoskeletal: Normal range of motion. She exhibits no edema.  Neurological: She is alert and  oriented to person, place, and time. No cranial nerve deficit. She exhibits normal muscle tone. Coordination normal.  Skin: Skin is warm and dry. She is not diaphoretic.  Nursing note and vitals reviewed.    ED Treatments / Results  Labs (all labs ordered are listed, but only abnormal results are displayed) Labs Reviewed  CULTURE, BLOOD (ROUTINE X 2)  CULTURE, BLOOD (ROUTINE X 2)  URINE CULTURE  COMPREHENSIVE METABOLIC PANEL  CBC WITH DIFFERENTIAL/PLATELET  URINALYSIS, ROUTINE W REFLEX MICROSCOPIC  I-STAT TROPONIN, ED  I-STAT CG4 LACTIC ACID, ED    EKG None  Radiology No results found.  Procedures Procedures (including  critical care time)  Medications Ordered in ED Medications  sodium chloride 0.9 % bolus 500 mL (has no administration in time range)     Initial Impression / Assessment and Plan / ED Course  I have reviewed the triage vital signs and the nursing notes.  Pertinent labs & imaging results that were available during my care of the patient were reviewed by me and considered in my medical decision making (see chart for details).  Patient with history of prior renal transplant presenting with diarrhea, weakness, and decreased p.o. intake.  Her workup reveals an increase in her creatinine and CT that shows inflammation surrounding the transplanted kidney.  I have discussed these findings with Dr. Mortimer Fries from the transplant service at [facility].  He does not feel as though this reflects rejection, but more likely dehydration secondary to diarrhea and decreased oral intake.  The patient was hydrated with 2 L of normal saline, but remains too weak to support her own weight when attempting to stand.  Dr. Mortimer Fries has agreed to accept the patient in transfer.  CRITICAL CARE Performed by: Geoffery Lyons Total critical care time: 45 minutes Critical care time was exclusive of separately billable procedures and treating other patients. Critical care was necessary to treat or  prevent imminent or life-threatening deterioration. Critical care was time spent personally by me on the following activities: development of treatment plan with patient and/or surrogate as well as nursing, discussions with consultants, evaluation of patient's response to treatment, examination of patient, obtaining history from patient or surrogate, ordering and performing treatments and interventions, ordering and review of laboratory studies, ordering and review of radiographic studies, pulse oximetry and re-evaluation of patient's condition.   Final Clinical Impressions(s) / ED Diagnoses   Final diagnoses:  None    ED Discharge Orders    None       Geoffery Lyons, MD 08/10/17 6962    Geoffery Lyons, MD 08/18/17 [phone]

## 2017-08-09 NOTE — ED Notes (Signed)
ED Provider at bedside. 

## 2017-08-09 NOTE — ED Triage Notes (Signed)
[facility]. Family called for pt. Incontinence starting early this week. EMS reports smell of UTI. Pt has not taken meds for last four days, including anti rejection meds for kidney transplant. Hx stroke. RR 24, HR 84 NSR, 88-91% on room air, 97% on 2L. 1000 NS PTA. 138/70. A/O x4

## 2017-08-10 ENCOUNTER — Emergency Department (HOSPITAL_COMMUNITY): Payer: Medicare Other

## 2017-08-10 DIAGNOSIS — E860 Dehydration: Secondary | ICD-10-CM | POA: Diagnosis not present

## 2017-08-10 LAB — CBC WITH DIFFERENTIAL/PLATELET
Basophils Absolute: 0 10*3/uL (ref 0.0–0.1)
Basophils Relative: 0 %
Eosinophils Absolute: 0 10*3/uL (ref 0.0–0.7)
Eosinophils Relative: 0 %
HEMATOCRIT: 38.2 % (ref 36.0–46.0)
HEMOGLOBIN: 12.1 g/dL (ref 12.0–15.0)
LYMPHS ABS: 1 10*3/uL (ref 0.7–4.0)
LYMPHS PCT: 35 %
MCH: 32 pg (ref 26.0–34.0)
MCHC: 31.7 g/dL (ref 30.0–36.0)
MCV: 101.1 fL — AB (ref 78.0–100.0)
MONO ABS: 0.1 10*3/uL (ref 0.1–1.0)
MONOS PCT: 2 %
NEUTROS ABS: 1.7 10*3/uL (ref 1.7–7.7)
NEUTROS PCT: 63 %
Platelets: 44 10*3/uL — ABNORMAL LOW (ref 150–400)
RBC: 3.78 MIL/uL — ABNORMAL LOW (ref 3.87–5.11)
RDW: 14.3 % (ref 11.5–15.5)
WBC: 2.7 10*3/uL — ABNORMAL LOW (ref 4.0–10.5)

## 2017-08-10 LAB — COMPREHENSIVE METABOLIC PANEL
ALBUMIN: 3.1 g/dL — AB (ref 3.5–5.0)
ALT: 21 U/L (ref 14–54)
ANION GAP: 13 (ref 5–15)
AST: 77 U/L — ABNORMAL HIGH (ref 15–41)
Alkaline Phosphatase: 32 U/L — ABNORMAL LOW (ref 38–126)
BUN: 56 mg/dL — ABNORMAL HIGH (ref 6–20)
CHLORIDE: 111 mmol/L (ref 101–111)
CO2: 19 mmol/L — ABNORMAL LOW (ref 22–32)
Calcium: 8.4 mg/dL — ABNORMAL LOW (ref 8.9–10.3)
Creatinine, Ser: 2.18 mg/dL — ABNORMAL HIGH (ref 0.44–1.00)
GFR calc non Af Amer: 21 mL/min — ABNORMAL LOW (ref >60)
GFR, EST AFRICAN AMERICAN: 24 mL/min — AB (ref >60)
GLUCOSE: 132 mg/dL — AB (ref 65–99)
Potassium: 4.7 mmol/L (ref 3.5–5.1)
Sodium: 143 mmol/L (ref 135–145)
Total Bilirubin: 0.7 mg/dL (ref 0.3–1.2)
Total Protein: 5.5 g/dL — ABNORMAL LOW (ref 6.5–8.1)

## 2017-08-10 LAB — URINALYSIS, ROUTINE W REFLEX MICROSCOPIC
BACTERIA UA: NONE SEEN
Bilirubin Urine: NEGATIVE
Glucose, UA: NEGATIVE mg/dL
KETONES UR: NEGATIVE mg/dL
Leukocytes, UA: NEGATIVE
Nitrite: NEGATIVE
PROTEIN: 30 mg/dL — AB
RBC / HPF: NONE SEEN RBC/hpf (ref 0–5)
Specific Gravity, Urine: 1.019 (ref 1.005–1.030)
Squamous Epithelial / LPF: NONE SEEN
pH: 5 (ref 5.0–8.0)

## 2017-08-10 LAB — I-STAT CG4 LACTIC ACID, ED
Lactic Acid, Venous: 1.5 mmol/L (ref 0.5–1.9)
Lactic Acid, Venous: 2.25 mmol/L (ref 0.5–1.9)

## 2017-08-10 LAB — I-STAT TROPONIN, ED: Troponin i, poc: 0.04 ng/mL (ref 0.00–0.08)

## 2017-08-10 MED ORDER — SODIUM CHLORIDE 0.9 % IV BOLUS
500.0000 mL | Freq: Once | INTRAVENOUS | Status: AC
  Administered 2017-08-10: 500 mL via INTRAVENOUS

## 2017-08-10 NOTE — ED Notes (Signed)
[facility] transport at bedside °

## 2017-08-10 NOTE — ED Notes (Signed)
ED Provider at bedside. 

## 2017-08-10 NOTE — ED Notes (Signed)
Patient transported to CT 

## 2017-08-10 NOTE — ED Notes (Signed)
Attempted to get pt out of bed to ambulate. Pt able to stand for short time at bedside but shortly had to sit back down due to weakness. Pt returned to bed in NAD.

## 2017-08-11 LAB — URINE CULTURE: CULTURE: NO GROWTH

## 2017-08-15 LAB — CULTURE, BLOOD (ROUTINE X 2)
CULTURE: NO GROWTH
Culture: NO GROWTH
Special Requests: ADEQUATE

## 2019-12-19 ENCOUNTER — Other Ambulatory Visit: Payer: Self-pay

## 2019-12-19 ENCOUNTER — Emergency Department (HOSPITAL_COMMUNITY)
Admission: EM | Admit: 2019-12-19 | Discharge: 2019-12-19 | Disposition: A | Discharge status: Home / Self Care | Payer: Medicare Other | Attending: Emergency Medicine | Admitting: Emergency Medicine

## 2019-12-19 ENCOUNTER — Emergency Department (HOSPITAL_COMMUNITY): Payer: Medicare Other

## 2019-12-19 DIAGNOSIS — Z5321 Procedure and treatment not carried out due to patient leaving prior to being seen by health care provider: Secondary | ICD-10-CM | POA: Diagnosis not present

## 2019-12-19 DIAGNOSIS — R42 Dizziness and giddiness: Secondary | ICD-10-CM | POA: Diagnosis present

## 2019-12-19 LAB — I-STAT CHEM 8, ED
BUN: 29 mg/dL — ABNORMAL HIGH (ref 8–23)
Calcium, Ion: 1.15 mmol/L (ref 1.15–1.40)
Chloride: 112 mmol/L — ABNORMAL HIGH (ref 98–111)
Creatinine, Ser: 1.8 mg/dL — ABNORMAL HIGH (ref 0.44–1.00)
Glucose, Bld: 114 mg/dL — ABNORMAL HIGH (ref 70–99)
HCT: 34 % — ABNORMAL LOW (ref 36.0–46.0)
Hemoglobin: 11.6 g/dL — ABNORMAL LOW (ref 12.0–15.0)
Potassium: 4.8 mmol/L (ref 3.5–5.1)
Sodium: 145 mmol/L (ref 135–145)
TCO2: 22 mmol/L (ref 22–32)

## 2019-12-19 LAB — COMPREHENSIVE METABOLIC PANEL
ALT: 18 U/L (ref 0–44)
AST: 21 U/L (ref 15–41)
Albumin: 4 g/dL (ref 3.5–5.0)
Alkaline Phosphatase: 39 U/L (ref 38–126)
Anion gap: 11 (ref 5–15)
BUN: 25 mg/dL — ABNORMAL HIGH (ref 8–23)
CO2: 22 mmol/L (ref 22–32)
Calcium: 8.4 mg/dL — ABNORMAL LOW (ref 8.9–10.3)
Chloride: 110 mmol/L (ref 98–111)
Creatinine, Ser: 1.97 mg/dL — ABNORMAL HIGH (ref 0.44–1.00)
GFR calc Af Amer: 27 mL/min — ABNORMAL LOW (ref >60)
GFR calc non Af Amer: 23 mL/min — ABNORMAL LOW (ref >60)
Glucose, Bld: 116 mg/dL — ABNORMAL HIGH (ref 70–99)
Potassium: 4.8 mmol/L (ref 3.5–5.1)
Sodium: 143 mmol/L (ref 135–145)
Total Bilirubin: 0.6 mg/dL (ref 0.3–1.2)
Total Protein: 7.2 g/dL (ref 6.5–8.1)

## 2019-12-19 LAB — DIFFERENTIAL
Abs Immature Granulocytes: 0.03 10*3/uL (ref 0.00–0.07)
Basophils Absolute: 0 10*3/uL (ref 0.0–0.1)
Basophils Relative: 0 %
Eosinophils Absolute: 0.1 10*3/uL (ref 0.0–0.5)
Eosinophils Relative: 1 %
Immature Granulocytes: 0 %
Lymphocytes Relative: 16 %
Lymphs Abs: 1.1 10*3/uL (ref 0.7–4.0)
Monocytes Absolute: 0.5 10*3/uL (ref 0.1–1.0)
Monocytes Relative: 7 %
Neutro Abs: 5.2 10*3/uL (ref 1.7–7.7)
Neutrophils Relative %: 76 %

## 2019-12-19 LAB — CBC
HCT: 35.8 % — ABNORMAL LOW (ref 36.0–46.0)
Hemoglobin: 10.7 g/dL — ABNORMAL LOW (ref 12.0–15.0)
MCH: 31.8 pg (ref 26.0–34.0)
MCHC: 29.9 g/dL — ABNORMAL LOW (ref 30.0–36.0)
MCV: 106.5 fL — ABNORMAL HIGH (ref 80.0–100.0)
Platelets: 177 10*3/uL (ref 150–400)
RBC: 3.36 MIL/uL — ABNORMAL LOW (ref 3.87–5.11)
RDW: 14 % (ref 11.5–15.5)
WBC: 6.9 10*3/uL (ref 4.0–10.5)
nRBC: 0 % (ref 0.0–0.2)

## 2019-12-19 LAB — APTT: aPTT: 24 seconds (ref 24–36)

## 2019-12-19 LAB — PROTIME-INR
INR: 1.1 (ref 0.8–1.2)
Prothrombin Time: 13.5 seconds (ref 11.4–15.2)

## 2019-12-19 MED ORDER — SODIUM CHLORIDE 0.9% FLUSH: 3.0000 mL | Freq: Once | INTRAVENOUS | Status: DC

## 2019-12-19 NOTE — ED Notes (Signed)
Ambulated out with husband 

## 2019-12-19 NOTE — ED Notes (Signed)
Patient requested to leave. Stated her husband doesn't drive at night. She reports feeling fine with no dizziness. Encouraged to stay. Said she would follow up with PCP on Monday. Encouraged to call 911 if any symptoms return.

## 2019-12-19 NOTE — ED Triage Notes (Signed)
Pt presents to ED with acute onset of dizziness. Pt reports when she got out of the cool car into the hot sun she felt "hot and dizzy" returned to the car and her husband felt her voice was "funny" pt reports her dizziness resolved immediately after getting back into the cool car. Pt denies abnormal speech

## 2020-01-10 ENCOUNTER — Telehealth (HOSPITAL_COMMUNITY): Payer: Self-pay | Admitting: Nurse Practitioner

## 2020-01-10 NOTE — Telephone Encounter (Signed)
Called to Discuss with patient about Covid symptoms and the use of the monoclonal antibody infusion for those with mild to moderate Covid symptoms and at a high risk of hospitalization.     Unfortunately, patient does not qualify for infusion d/t symptoms starting >10 days ago. Per grandson, symptoms started 12/29/19.  Gerlean Ren, DNP, AGNP-C

## 2020-01-16 DIAGNOSIS — I350 Nonrheumatic aortic (valve) stenosis: Secondary | ICD-10-CM | POA: Diagnosis not present

## 2020-07-23 DEATH — deceased

## 2021-03-17 IMAGING — CT CT HEAD W/O CM
3 of 4 series · 13 of 47 positions shown, 15 images · non-contrast
Comparison: None.

CLINICAL DATA: TIA.

EXAM:
CT HEAD WITHOUT CONTRAST
TECHNIQUE: Contiguous axial images were obtained from the base of the skull
through the vertex without intravenous contrast.

[Series 3: head without · axial · non-contrast · 0.45mm/px · z∈[+1202,+1312]mm · 7 of 30 slices shown, 9 images]
[im 4/30  brain]
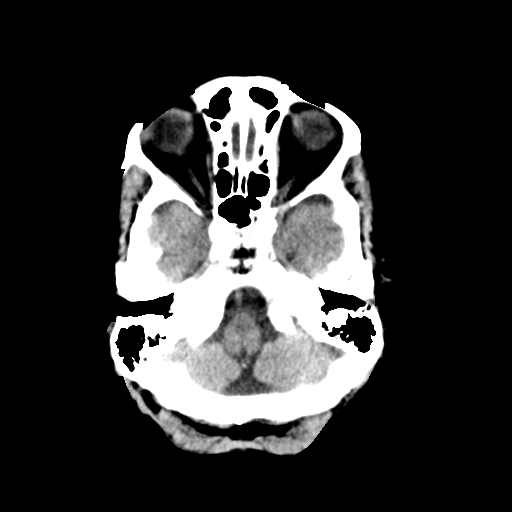
[im 4/30  bone]
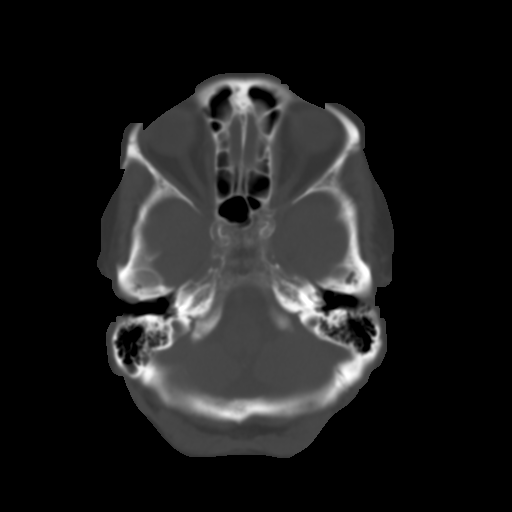
[im 8/30  brain]
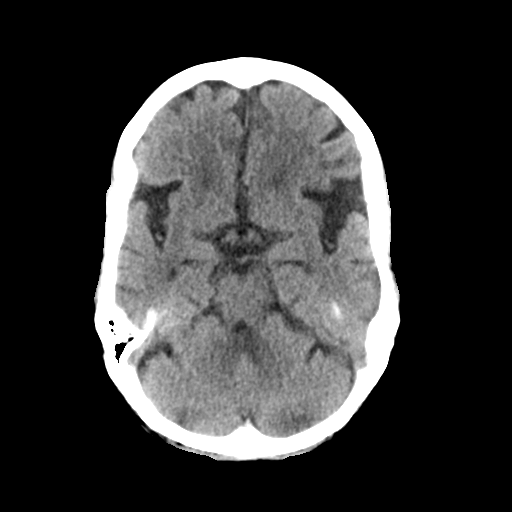
[im 11/30  brain]
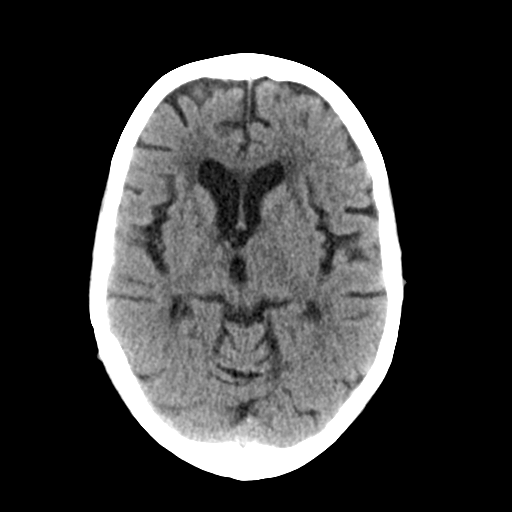
[im 15/30  brain]
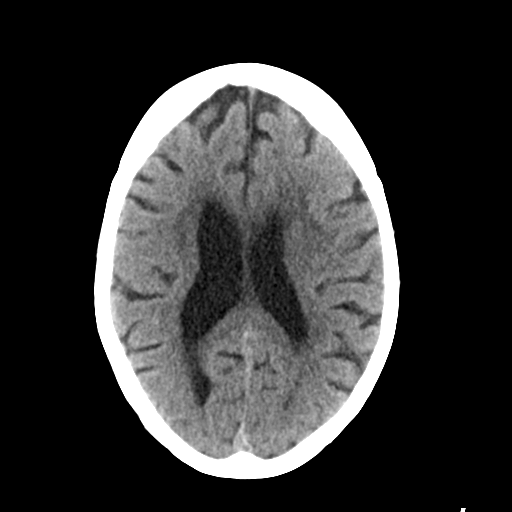
[im 19/30  brain]
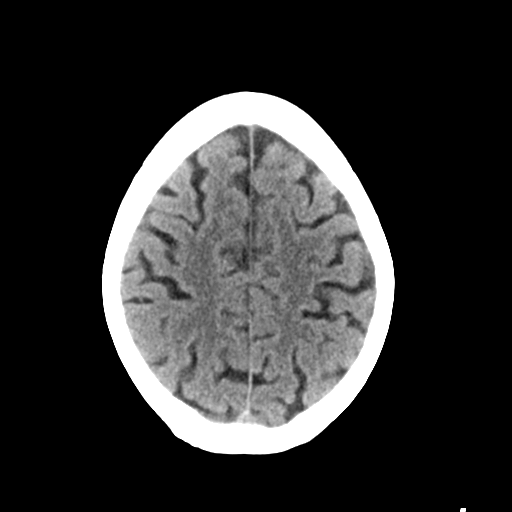
[im 19/30  bone]
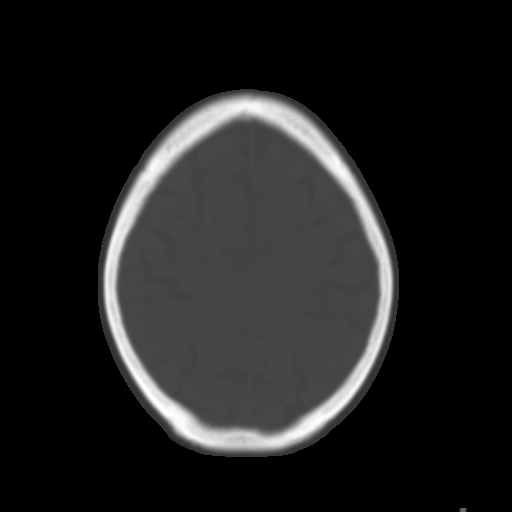
[im 22/30  brain]
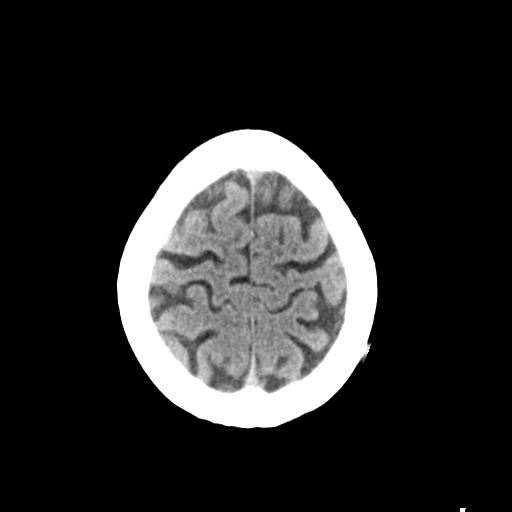
[im 26/30  brain]
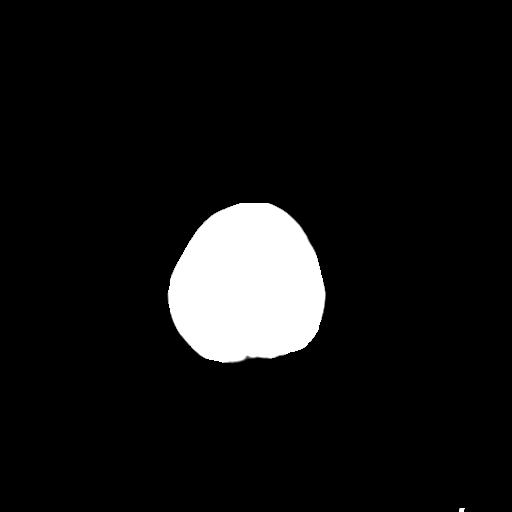

[Series 5: head without cor · coronal · non-contrast · 0.33mm/px · 3 of 72 slices shown]
[im 24/72  brain]
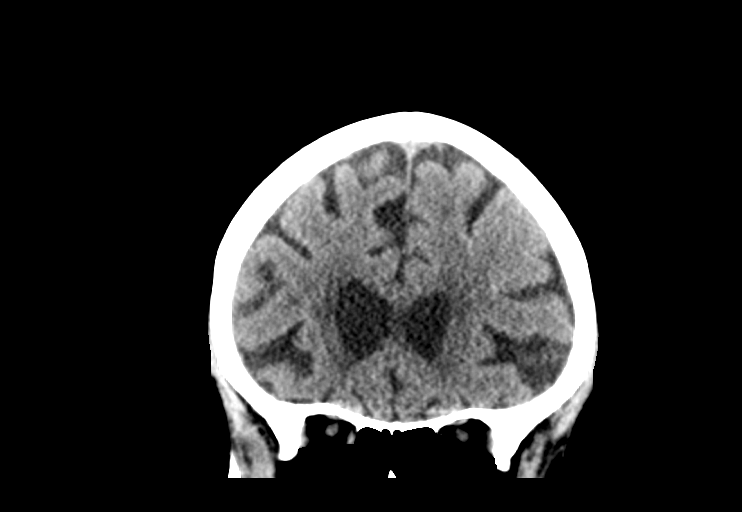
[im 32/72  brain]
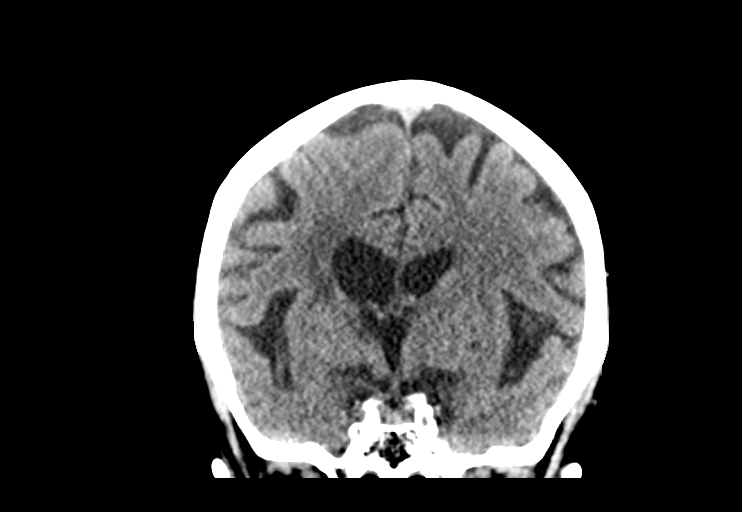
[im 40/72  brain]
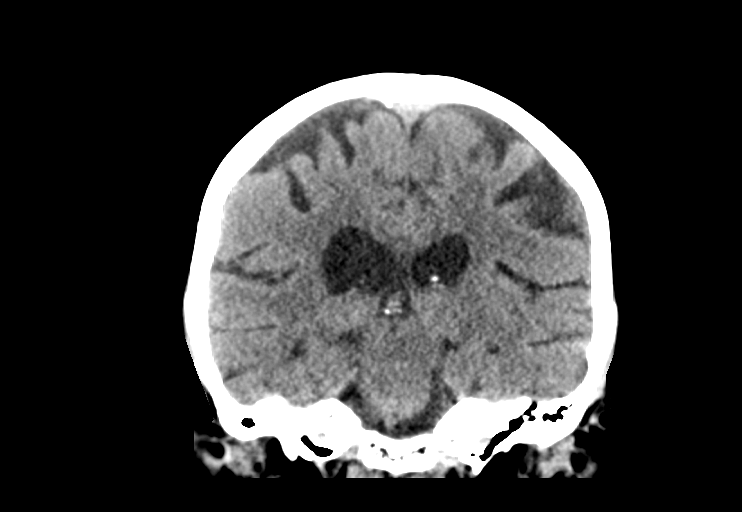

[Series 6: head without sag · sagittal · non-contrast · 0.28mm/px · 3 of 67 slices shown]
[im 23/67  brain]
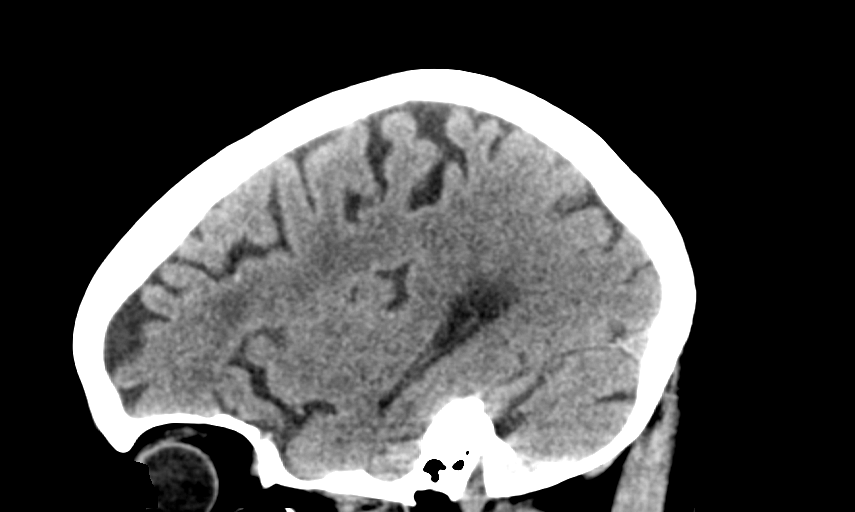
[im 34/67  brain]
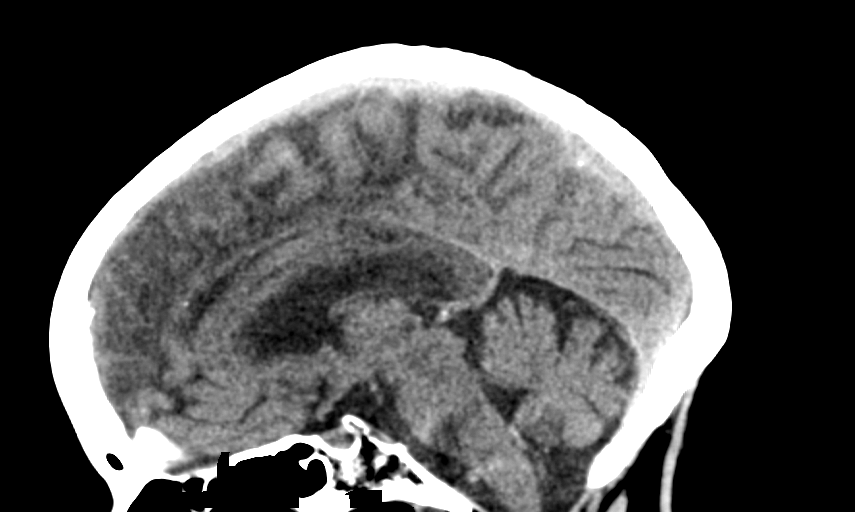
[im 45/67  brain]
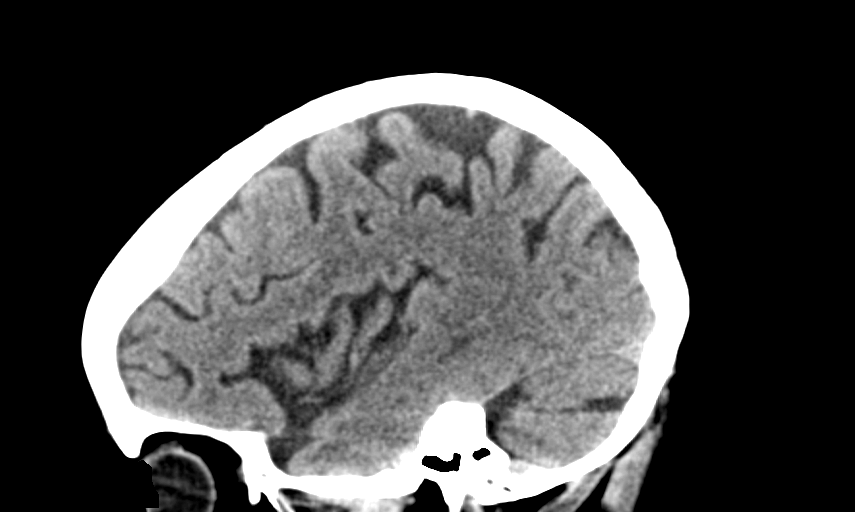

[13 of 47 positions shown; findings below may reference images not displayed]

FINDINGS: Brain: No acute intracranial hemorrhage. No focal mass lesion. No CT
evidence of acute infarction. No midline shift or mass effect. No
hydrocephalus. Basilar cisterns are patent.

There are periventricular and subcortical white matter
hypodensities. Generalized cortical atrophy.

Vascular: No hyperdense vessel or unexpected calcification.

Skull: Normal. Negative for fracture or focal lesion.

Sinuses/Orbits: Paranasal sinuses and mastoid air cells are clear.
Orbits are clear.

Other: None.
IMPRESSION: 1. No acute intracranial findings.
2. Atrophy and white matter microvascular disease.
